# Patient Record
Sex: Male | Born: 1980 | Race: White | Hispanic: No | Marital: Single | Smoking: Current every day smoker
Health system: Southern US, Community
[De-identification: ages and names within clinical notes are randomized; demographics above are authoritative.]

---

## 2013-09-18 ENCOUNTER — Emergency Department (HOSPITAL_COMMUNITY)
Admission: EM | Admit: 2013-09-18 | Discharge: 2013-09-18 | Disposition: A | Discharge status: Home / Self Care | Payer: BC Managed Care – PPO | Attending: Emergency Medicine | Admitting: Emergency Medicine

## 2013-09-18 ENCOUNTER — Emergency Department (HOSPITAL_COMMUNITY): Payer: BC Managed Care – PPO

## 2013-09-18 ENCOUNTER — Encounter (HOSPITAL_COMMUNITY): Payer: Self-pay | Admitting: Emergency Medicine

## 2013-09-18 DIAGNOSIS — J9801 Acute bronchospasm: Secondary | ICD-10-CM | POA: Diagnosis not present

## 2013-09-18 DIAGNOSIS — J3489 Other specified disorders of nose and nasal sinuses: Secondary | ICD-10-CM | POA: Diagnosis present

## 2013-09-18 DIAGNOSIS — F17200 Nicotine dependence, unspecified, uncomplicated: Secondary | ICD-10-CM | POA: Diagnosis not present

## 2013-09-18 DIAGNOSIS — J069 Acute upper respiratory infection, unspecified: Secondary | ICD-10-CM | POA: Diagnosis not present

## 2013-09-18 MED ORDER — PSEUDOEPHEDRINE HCL ER 120 MG PO TB12: 120.0000 mg | ORAL_TABLET | Freq: Two times a day (BID) | ORAL | Status: DC

## 2013-09-18 MED ORDER — ALBUTEROL SULFATE HFA 108 (90 BASE) MCG/ACT IN AERS
2.0000 | INHALATION_SPRAY | RESPIRATORY_TRACT | Status: DC | PRN
Start: 2013-09-18 — End: 2013-09-19
  Administered 2013-09-18: 2 via RESPIRATORY_TRACT
  Filled 2013-09-18: qty 6.7

## 2013-09-18 MED ORDER — PSEUDOEPHEDRINE HCL ER 120 MG PO TB12
120.0000 mg | ORAL_TABLET | Freq: Two times a day (BID) | ORAL | Status: DC
  Administered 2013-09-18: 120 mg via ORAL
  Filled 2013-09-18: qty 1

## 2013-09-18 NOTE — ED Notes (Addendum)
He states hes had a painful cough with sputum and dry scratchy throat since Thursday night. He reports a history of allergies. He denies fevers. He is A&Ox4, resp e/u

## 2013-09-18 NOTE — Discharge Instructions (Signed)
Bronchospasm A bronchospasm is when the tubes that carry air in and out of your lungs (airways) spasm or tighten. During a bronchospasm it is hard to breathe. This is because the airways get smaller. A bronchospasm can be triggered by:  Allergies. These may be to animals, pollen, food, or mold.  Infection. This is a common cause of bronchospasm.  Exercise.  Irritants. These include pollution, cigarette smoke, strong odors, aerosol sprays, and paint fumes.  Weather changes.  Stress.  Being emotional. HOME CARE   Always have a plan for getting help. Know when to call your doctor and local emergency services (911 in the [place] Know where you can get emergency care.  Only take medicines as told by your doctor.  If you were prescribed an inhaler or nebulizer machine, ask your doctor how to use it correctly. Always use a spacer with your inhaler if you were given one.  Stay calm during an attack. Try to relax and breathe more slowly.  Control your home environment:  Change your heating and air conditioning filter at least once a month.  Limit your use of fireplaces and wood stoves.  Do not  smoke. Do not  allow smoking in your home.  Avoid perfumes and fragrances.  Get rid of pests (such as roaches and mice) and their droppings.  Throw away plants if you see mold on them.  Keep your house clean and dust free.  Replace carpet with wood, tile, or vinyl flooring. Carpet can trap dander and dust.  Use allergy-proof pillows, mattress covers, and box spring covers.  Wash bed sheets and blankets every week in hot water. Dry them in a dryer.  Use blankets that are made of polyester or cotton.  Wash hands frequently. GET HELP IF:  You have muscle aches.  You have chest pain.  The thick spit you spit or cough up (sputum) changes from clear or white to yellow, green, gray, or bloody.  The thick spit you spit or cough up gets thicker.  There are problems that may be related  to the medicine you are given such as:  A rash.  Itching.  Swelling.  Trouble breathing. GET HELP RIGHT AWAY IF:  You feel you cannot breathe or catch your breath.  You cannot stop coughing.  Your treatment is not helping you breathe better.  You have very bad chest pain. MAKE SURE YOU:   Understand these instructions.  Will watch your condition.  Will get help right away if you are not doing well or get worse. Document Released: 10/27/2008 Document Revised: 01/04/2013 Document Reviewed: 06/22/2012 Exit[employer] Patient Information 2015 [employer]. This information is not intended to replace advice given to you by your health care provider. Make sure you discuss any questions you have with your health care provider.  

## 2013-09-18 NOTE — ED Provider Notes (Signed)
CSN: [id number]     Arrival date & time 09/18/13  1829 History   First MD Initiated Contact with Patient 09/18/13 1948     Chief Complaint  Patient presents with  . URI     (Consider location/radiation/quality/duration/timing/severity/associated sxs/prior Treatment) HPI Comments: Patient with a history of seasonal allergies.  He, states he hasn't been bothered with these for the last 2 seasons reports, that he's had 4 days of nasal congestion, sore throat, postnasal drip, and a cough with clear sputum.  Denies any fever, history of wheezing, has not taken any in her seasonal allergies, or his symptoms.  Patient is a 33 y.o. male presenting with URI. The history is provided by the patient.  URI Presenting symptoms: congestion, cough, rhinorrhea and sore throat   Presenting symptoms: no fever   Congestion:    Location:  Nasal and chest   Interferes with sleep: no     Interferes with eating/drinking: no   Cough:    Cough characteristics:  Productive   Sputum characteristics:  Nondescript   Severity:  Mild   Onset quality:  Gradual   Duration:  4 days   Timing:  Intermittent   Progression:  Worsening   Chronicity:  New Rhinorrhea:    Quality:  Clear   Severity:  Moderate   Timing:  Intermittent   Progression:  Worsening Sore throat:    Severity:  Moderate   Onset quality:  Gradual   Duration:  3 days   Timing:  Constant Severity:  Mild Onset quality:  Gradual Timing:  Intermittent Worsened by:  Nothing tried Ineffective treatments:  None tried Associated symptoms: wheezing   Associated symptoms: no headaches, no myalgias, no neck pain, no sinus pain, no sneezing and no swollen glands     History reviewed. No pertinent past medical history. History reviewed. No pertinent past surgical history. History reviewed. No pertinent family history. History  Substance Use Topics  . Smoking status: Current Every Day Smoker    Types: Cigarettes  . Smokeless tobacco: Not on file   . Alcohol Use: Yes    Review of Systems  Constitutional: Negative for fever.  HENT: Positive for congestion, rhinorrhea and sore throat. Negative for sneezing.   Respiratory: Positive for cough and wheezing.   Musculoskeletal: Negative for myalgias and neck pain.  Skin: Negative for rash.  Neurological: Negative for headaches.  All other systems reviewed and are negative.     Allergies  Review of patient's allergies indicates no known allergies.  Home Medications   Prior to Admission medications   Medication Sig Start Date End Date Taking? Authorizing Provider  pseudoephedrine (SUDAFED 12 HOUR) 120 MG 12 hr tablet Take 1 tablet (120 mg total) by mouth 2 (two) times daily. 09/18/13   Arman Filter, NP   BP 126/89  Pulse 89  Temp(Src) 98.5 F (36.9 C) (Oral)  Resp 16  Ht  (1.778 m)  SpO2 95% Physical Exam  Nursing note and vitals reviewed. Constitutional: He is oriented to person, place, and time. He appears well-developed and well-nourished. No distress.  HENT:  Head: Normocephalic.  Mouth/Throat: Uvula is midline and mucous membranes are normal. No uvula swelling. Posterior oropharyngeal erythema present. No oropharyngeal exudate, posterior oropharyngeal edema or tonsillar abscesses.  Neck: Normal range of motion.  Cardiovascular: Normal rate and regular rhythm.   Pulmonary/Chest: Effort normal. No respiratory distress. He has wheezes.  Musculoskeletal: Normal range of motion.  Lymphadenopathy:    He has no cervical adenopathy.  Neurological:  He is alert and oriented to person, place, and time.  Skin: Skin is warm and dry. No rash noted.    ED Course  Procedures (including critical care time) Labs Review Labs Reviewed - No data to display  Imaging Review Dg Chest 2 View (if Patient Has Fever And/or Copd)  09/18/2013   CLINICAL DATA:  Shortness of breath. Cough. Chest pain. Tobacco use.  EXAM: CHEST  2 VIEW  COMPARISON:  None.  FINDINGS: Mild interstitial  accentuation, as can commonly be encountered in smokers. The lungs appear otherwise clear. Cardiac and mediastinal margins appear normal. No pleural effusion.  IMPRESSION: 1. No acute findings. Mild interstitial accentuation, as can commonly be encountered in smokers.   Electronically Signed   By: Herbie Baltimore M.D.   On: 09/18/2013 20:31     EKG Interpretation None      MDM   Final diagnoses:  URI, acute  Bronchospasm          Arman Filter, NP 09/18/13 2051

## 2013-09-22 NOTE — ED Provider Notes (Signed)
Medical screening examination/treatment/procedure(s) were performed by non-physician practitioner and as supervising physician I was immediately available for consultation/collaboration.   EKG Interpretation None        Vanetta Mulders, MD 09/22/13 [phone]

## 2015-08-14 IMAGING — CR DG CHEST 2V
2 series · 2 of 2 positions shown · non-contrast
Comparison: None.

CLINICAL DATA: Shortness of breath. Cough. Chest pain. Tobacco use.

EXAM:
CHEST  2 VIEW

[w chest pa]
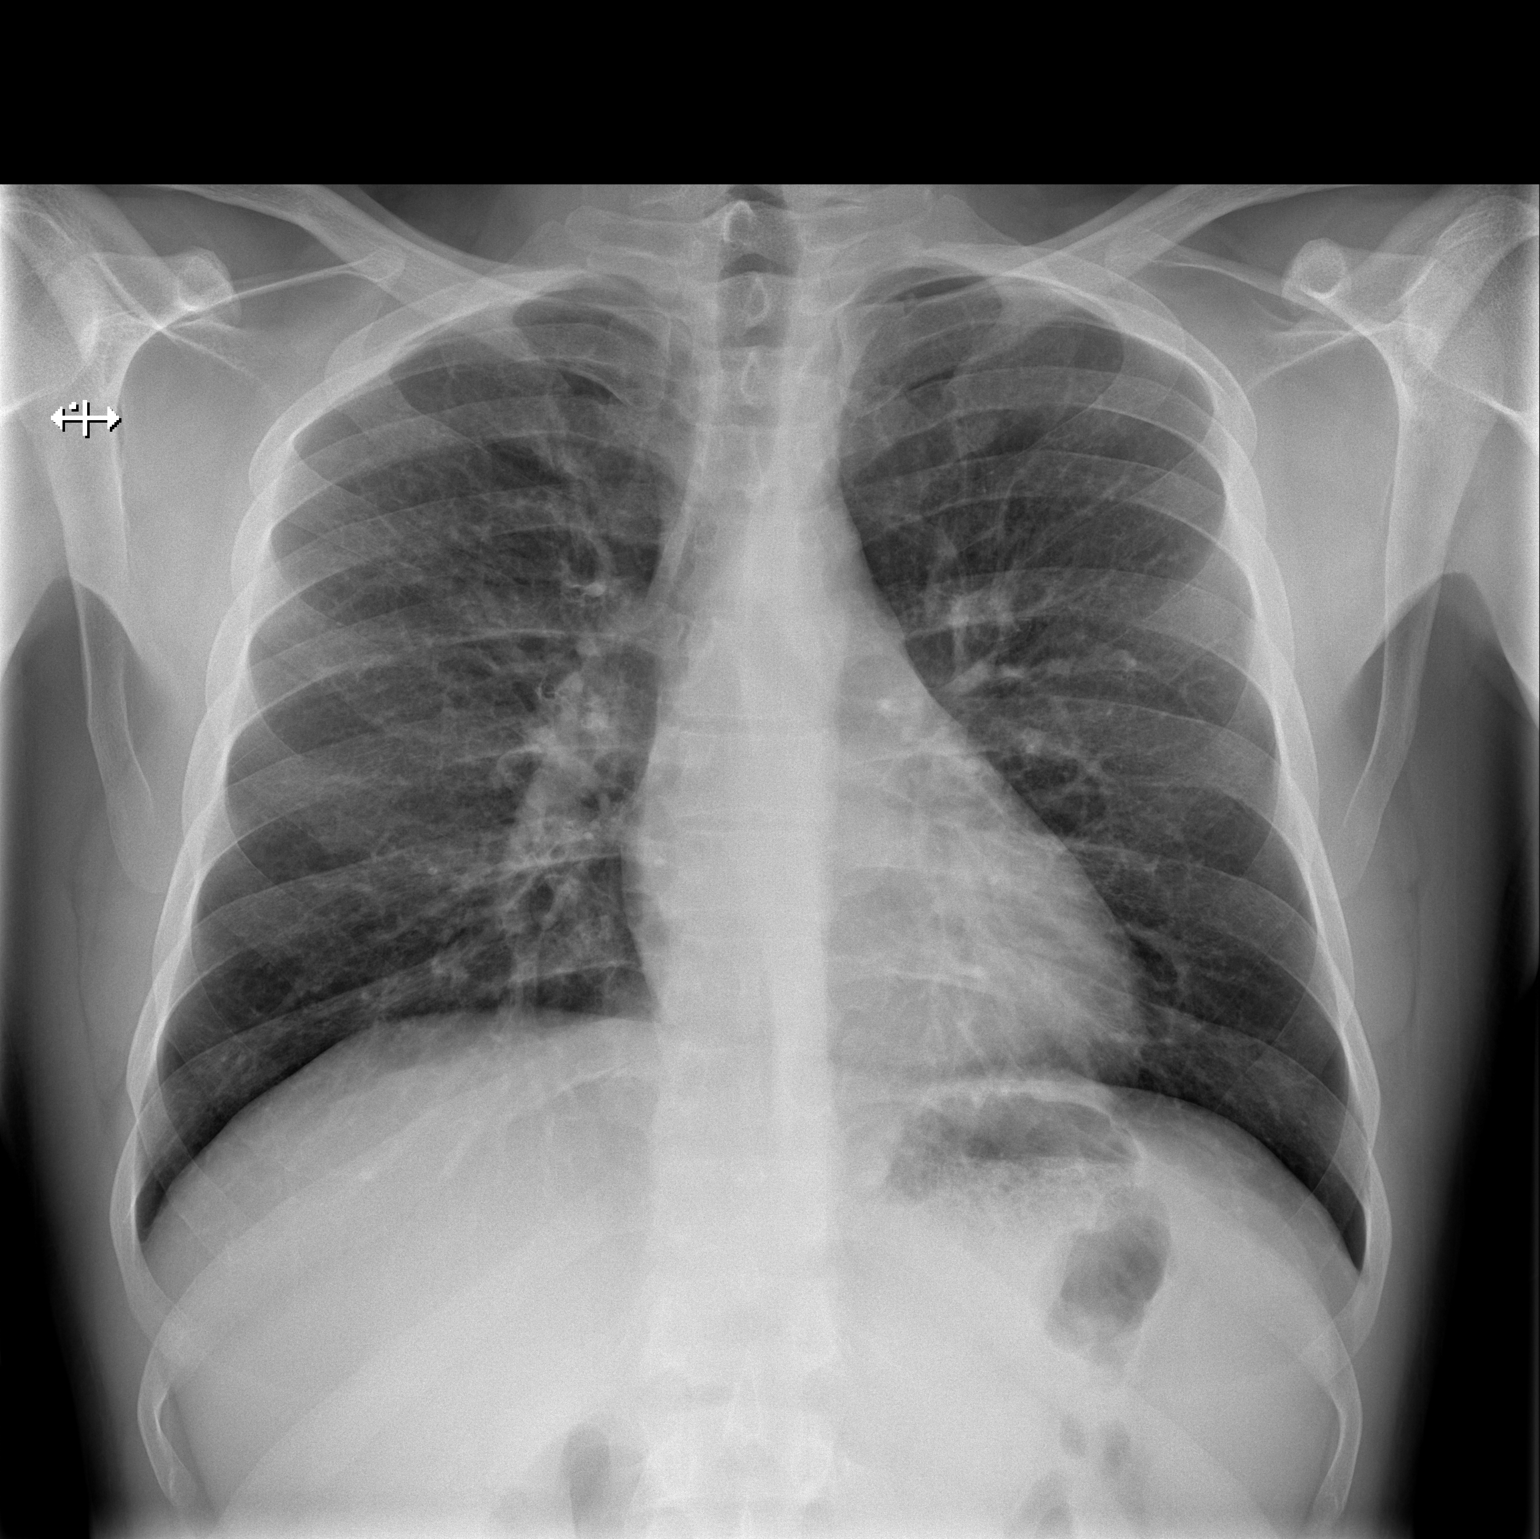

[w chest lat]
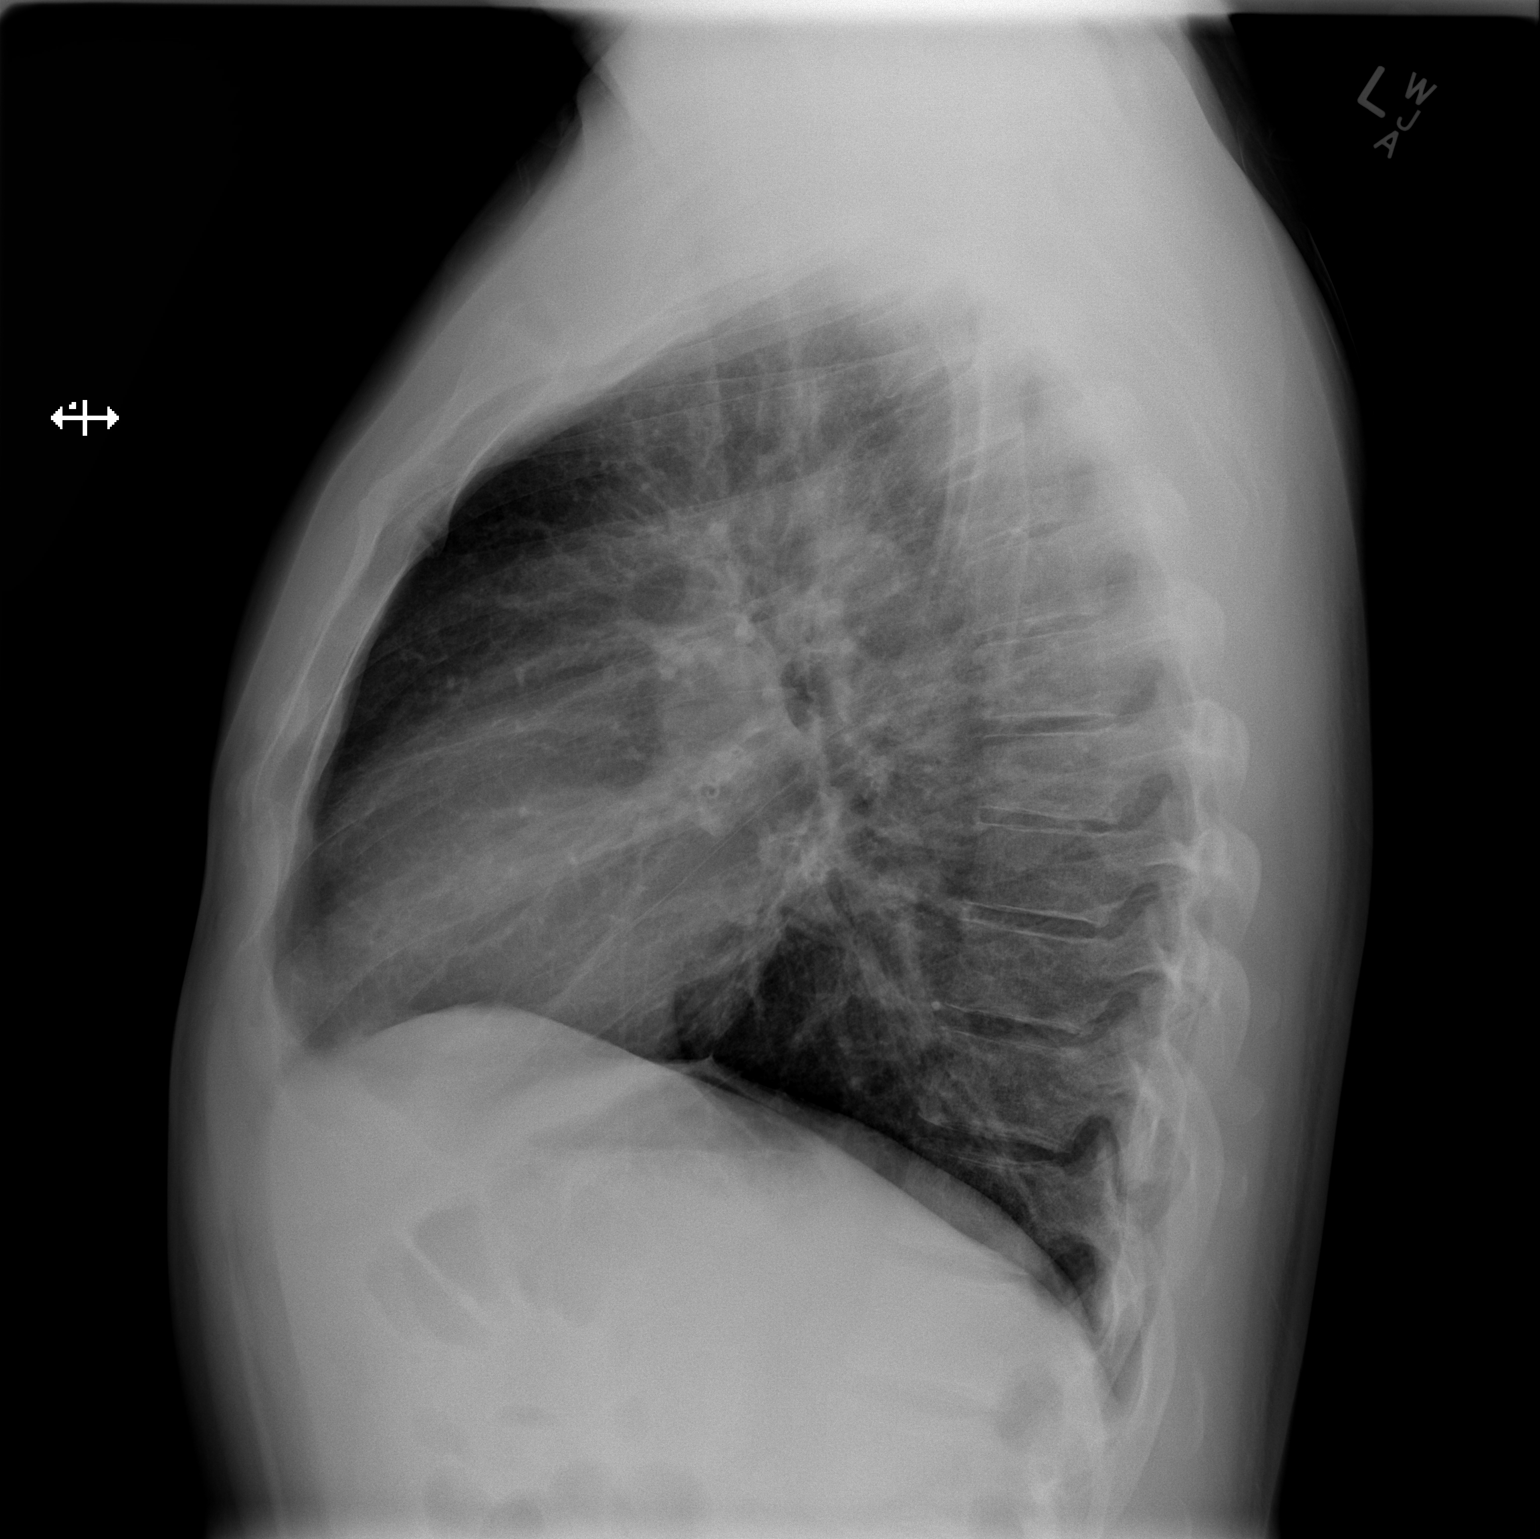

[2 of 2 positions shown; findings below may reference images not displayed]

FINDINGS: Mild interstitial accentuation, as can commonly be encountered in
smokers. The lungs appear otherwise clear. Cardiac and mediastinal
margins appear normal. No pleural effusion.
IMPRESSION: 1. No acute findings. Mild interstitial accentuation, as can
commonly be encountered in smokers.

## 2016-06-23 ENCOUNTER — Emergency Department (HOSPITAL_BASED_OUTPATIENT_CLINIC_OR_DEPARTMENT_OTHER)
Admission: EM | Admit: 2016-06-23 | Discharge: 2016-06-23 | Disposition: A | Discharge status: Home / Self Care | Payer: Self-pay | Attending: Emergency Medicine | Admitting: Emergency Medicine

## 2016-06-23 ENCOUNTER — Encounter (HOSPITAL_BASED_OUTPATIENT_CLINIC_OR_DEPARTMENT_OTHER): Payer: Self-pay

## 2016-06-23 DIAGNOSIS — Z23 Encounter for immunization: Secondary | ICD-10-CM | POA: Insufficient documentation

## 2016-06-23 DIAGNOSIS — H1031 Unspecified acute conjunctivitis, right eye: Secondary | ICD-10-CM | POA: Insufficient documentation

## 2016-06-23 DIAGNOSIS — F17210 Nicotine dependence, cigarettes, uncomplicated: Secondary | ICD-10-CM | POA: Insufficient documentation

## 2016-06-23 MED ORDER — LEVOFLOXACIN 0.5 % OP SOLN: 1.0000 [drp] | OPHTHALMIC | 0 refills | Status: AC

## 2016-06-23 MED ORDER — TETRACAINE HCL 0.5 % OP SOLN
1.0000 [drp] | Freq: Once | OPHTHALMIC | Status: AC
  Administered 2016-06-23: 1 [drp] via OPHTHALMIC
  Filled 2016-06-23: qty 4

## 2016-06-23 MED ORDER — TETANUS-DIPHTH-ACELL PERTUSSIS 5-2.5-18.5 LF-MCG/0.5 IM SUSP
0.5000 mL | Freq: Once | INTRAMUSCULAR | Status: AC
  Administered 2016-06-23: 0.5 mL via INTRAMUSCULAR
  Filled 2016-06-23: qty 0.5

## 2016-06-23 MED ORDER — FLUORESCEIN SODIUM 0.6 MG OP STRP
1.0000 | ORAL_STRIP | Freq: Once | OPHTHALMIC | Status: AC
  Administered 2016-06-23: 1 via OPHTHALMIC
  Filled 2016-06-23: qty 1

## 2016-06-23 MED ORDER — IBUPROFEN 800 MG PO TABS
800.0000 mg | ORAL_TABLET | Freq: Once | ORAL | Status: AC
  Administered 2016-06-23: 800 mg via ORAL
  Filled 2016-06-23: qty 1

## 2016-06-23 MED ORDER — TRAMADOL HCL 50 MG PO TABS: 50.0000 mg | ORAL_TABLET | Freq: Four times a day (QID) | ORAL | 0 refills | Status: AC | PRN

## 2016-06-23 MED ORDER — OXYCODONE-ACETAMINOPHEN 5-325 MG PO TABS
1.0000 | ORAL_TABLET | Freq: Once | ORAL | Status: AC
  Administered 2016-06-23: 1 via ORAL
  Filled 2016-06-23: qty 1

## 2016-06-23 NOTE — ED Notes (Signed)
[facility]

## 2016-06-23 NOTE — ED Triage Notes (Signed)
C/o right eye redness yesterday-swelling noted today-denies injury-NAD-steady gait

## 2016-06-23 NOTE — ED Notes (Addendum)
Reports working in yard Saturday, irritated eye at that time, gradually progressively worse, worst today, c/o pain, inflammation, tearing, yellow matted d/c in the mornings, (denies: known f/b, known injury, fever, nv, dizziness), **wears contacts**, [facility], verbalizes needing inexpensive options d/t no insurance. Guarding R eye, presently inflamed. Using visine.   Alert, NAD, calm, interactive, resps e/u, speaking in clear complete sentences, no dyspnea noted, skin W&D. [facility]

## 2016-06-23 NOTE — ED Provider Notes (Signed)
Emergency Department Provider Note  By signing my name below, I, Soijett Blue, attest that this documentation has been prepared under the direction and in the presence of Lindwood Mogel, Arlyss Repress, MD. Electronically Signed: Soijett Blue, ED Scribe. 06/23/16. 9:28 PM.  I have reviewed the triage vital signs and the nursing notes.   HISTORY  Chief Complaint Eye Problem   HPI Ryan Howell is a 36 y.o. male who presents to the Emergency Department complaining of right eye problem onset  eye pain onset 2 days worsening this morning. Pt reports associated right eye pain, right eye redness, right eye discharge, frontal HA, and blurred vision. Pt has not tried any medications for the relief of his symptoms. He notes that he was working outside and was wearing safety glasses prior to the onset of his symptoms. Pt reports that he wears contacts and he makes sure to take his contacts out nightly. Denies FB sensation and any other symptoms.     History reviewed. No pertinent past medical history.  There are no active problems to display for this patient.   History reviewed. No pertinent surgical history.    Allergies Patient has no known allergies.  No family history on file.  Social History Social History  Substance Use Topics  . Smoking status: Current Every Day Smoker    Types: Cigarettes  . Smokeless tobacco: Never Used  . Alcohol use No    Review of Systems Constitutional: No fever/chills Eyes: Positive for right eye pain, right eye redness, and right eye drainage. Positive for blurred vision. Negative FB sensation to right eye.  ENT: No sore throat. Cardiovascular: Denies chest pain. Respiratory: Denies shortness of breath. Gastrointestinal: No abdominal pain.  No nausea, no vomiting.  No diarrhea.  No constipation. Genitourinary: Negative for dysuria. Musculoskeletal: Negative for back pain. Skin: Negative for rash. Neurological: Positive for frontal headache.  Negative for focal weakness or numbness.  10-point ROS otherwise negative.  ____________________________________________   PHYSICAL EXAM:  VITAL SIGNS: ED Triage Vitals  Enc Vitals Group     BP 06/23/16 2041 (!) 136/94     Pulse Rate 06/23/16 2041 72     Resp 06/23/16 2041 18     Temp 06/23/16 2041 97.9 F (36.6 C)     Temp Source 06/23/16 2041 Oral     SpO2 06/23/16 2041 100 %     Weight 06/23/16 2041 215 lb 8 oz (97.8 kg)     Pain Score 06/23/16 2040 6   Constitutional: Alert and oriented. Well appearing and in no acute distress. Eyes: Conjunctivae are injected on the right. No obvious corneal abrasion on florescence exam. Moderate purulent discharge from the eye.  Head: Atraumatic. Nose: No congestion/rhinnorhea. Mouth/Throat: Mucous membranes are moist.  Oropharynx non-erythematous. Neck: No stridor.   Musculoskeletal: No lower extremity tenderness nor edema. No gross deformities of extremities. Neurologic:  Normal speech and language. No gross focal neurologic deficits are appreciated.  Skin:  Skin is warm, dry and intact. No rash noted. Psychiatric: Mood and affect are normal. Speech and behavior are normal.  ____________________________________________   PROCEDURES  Procedure(s) performed:   Procedures  None ____________________________________________   INITIAL IMPRESSION / ASSESSMENT AND PLAN / ED COURSE  Pertinent labs & imaging results that were available during my care of the patient were reviewed by me and considered in my medical decision making (see chart for details).  Patient with evidence of conjunctivitis on exam. No FB or corneal abrasion identified. Plan for abx drops (  Levofloxacin with contact lens use). Advised close ophthalmology follow up and to not use contact lenses until cleared to do so by eye doctor. With questionable FB initially the patient's tetanus was updated. Sent home with Tramadol for breakthrough pain.   At this time, I do  not feel there is any life-threatening condition present. I have reviewed and discussed all results (EKG, imaging, lab, urine as appropriate), exam findings with patient. I have reviewed nursing notes and appropriate previous records.  I feel the patient is safe to be discharged home without further emergent workup. Discussed usual and customary return precautions. Patient and family (if present) verbalize understanding and are comfortable with this plan.  Patient will follow-up with their primary care provider. If they do not have a primary care provider, information for follow-up has been provided to them. All questions have been answered.  ____________________________________________  FINAL CLINICAL IMPRESSION(S) / ED DIAGNOSES  Final diagnoses:  Acute conjunctivitis of right eye, unspecified acute conjunctivitis type     MEDICATIONS GIVEN DURING THIS VISIT:  Medications  oxyCODONE-acetaminophen (PERCOCET/ROXICET) 5-325 MG per tablet 1 tablet (1 tablet Oral Given 06/23/16 2113)  ibuprofen (ADVIL,MOTRIN) tablet 800 mg (800 mg Oral Given 06/23/16 2114)  fluorescein ophthalmic strip 1 strip (1 strip Right Eye Given by Other 06/23/16 2121)  tetracaine (PONTOCAINE) 0.5 % ophthalmic solution 1 drop (1 drop Right Eye Given 06/23/16 2116)  Tdap (BOOSTRIX) injection 0.5 mL (0.5 mLs Intramuscular Given 06/23/16 2146)     NEW OUTPATIENT MEDICATIONS STARTED DURING THIS VISIT:  Discharge Medication List as of 06/23/2016  9:40 PM    START taking these medications   Details  !! levofloxacin (QUIXIN) 0.5 % ophthalmic solution Place 1 drop into the right eye every 2 (two) hours., Starting Mon 06/23/2016, Until Wed 06/25/2016, Print    !! levofloxacin Charlean Sanfilippo(QUIXIN) 0.5 % ophthalmic solution Place 1 drop into the right eye every 4 (four) hours., Starting Mon 06/23/2016, Until Sat 06/28/2016, Print    traMADol (ULTRAM) 50 MG tablet Take 1 tablet (50 mg total) by mouth every 6 (six) hours as needed., Starting Mon  06/23/2016, Print     !! - Potential duplicate medications found. Please discuss with provider.      Note:  This document was prepared using Dragon voice recognition software and may include unintentional dictation errors.  Alona BeneJoshua Leelan Rajewski, MD Emergency Medicine   I personally performed the services described in this documentation, which was scribed in my presence. The recorded information has been reviewed and is accurate.       Maia PlanLong, Quentin Strebel G, MD 06/24/16 [phone]

## 2016-06-23 NOTE — Discharge Instructions (Signed)
You have been seen in the ED today with right eye pain and redness. Do not put your contacts back in until the pain and redness resolve. You should follow up with the eye doctor in the next 24 hours. Use the eye drops provided every 2 hours for the first 2 days and then every 4 hours for the next 5 days.   Return to the ED with any sudden worsening eye pain or worsening vision.
